# Patient Record
Sex: Male | Born: 1995 | Race: White | Hispanic: No | Marital: Single | Smoking: Current every day smoker
Health system: Southern US, Community
[De-identification: ages and names within clinical notes are randomized; demographics above are authoritative.]

## PROBLEM LIST (undated history)

## (undated) HISTORY — PX: FINGER SURGERY: SHX640

---
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
## 2014-08-24 ENCOUNTER — Emergency Department
Admit: 2014-08-24 | Disposition: A | Discharge status: Home / Self Care | Payer: Self-pay | Admitting: Emergency Medicine

## 2014-08-24 LAB — CBC WITH DIFFERENTIAL/PLATELET
Basophil #: 0 10*3/uL (ref 0.0–0.1)
Basophil %: 0.2 %
EOS PCT: 0.9 %
Eosinophil #: 0.2 10*3/uL (ref 0.0–0.7)
HCT: 53.1 % — ABNORMAL HIGH (ref 40.0–52.0)
HGB: 18.1 g/dL — ABNORMAL HIGH (ref 13.0–18.0)
LYMPHS ABS: 1.8 10*3/uL (ref 1.0–3.6)
Lymphocyte %: 8.3 %
MCH: 31.4 pg (ref 26.0–34.0)
MCHC: 34 g/dL (ref 32.0–36.0)
MCV: 92 fL (ref 80–100)
Monocyte #: 0.8 x10 3/mm (ref 0.2–1.0)
Monocyte %: 3.7 %
NEUTROS PCT: 86.9 %
Neutrophil #: 19.3 10*3/uL — ABNORMAL HIGH (ref 1.4–6.5)
PLATELETS: 270 10*3/uL (ref 150–440)
RBC: 5.75 10*6/uL (ref 4.40–5.90)
RDW: 12.7 % (ref 11.5–14.5)
WBC: 22.2 10*3/uL — ABNORMAL HIGH (ref 3.8–10.6)

## 2014-08-24 LAB — COMPREHENSIVE METABOLIC PANEL
ALK PHOS: 118 U/L
ANION GAP: 9 (ref 7–16)
Albumin: 5.2 g/dL — ABNORMAL HIGH
BILIRUBIN TOTAL: 0.7 mg/dL
BUN: 13 mg/dL
CALCIUM: 9.6 mg/dL
CO2: 25 mmol/L
CREATININE: 1.1 mg/dL
Chloride: 104 mmol/L
EGFR (Non-African Amer.): >60
GLUCOSE: 131 mg/dL — AB
Potassium: 3.9 mmol/L
SGOT(AST): 33 U/L
SGPT (ALT): 34 U/L
SODIUM: 138 mmol/L
Total Protein: 8.4 g/dL — ABNORMAL HIGH

## 2014-08-24 LAB — ETHANOL

## 2014-08-24 LAB — URINALYSIS, COMPLETE
BILIRUBIN, UR: NEGATIVE
Blood: NEGATIVE
Glucose,UR: NEGATIVE mg/dL (ref 0–75)
KETONE: NEGATIVE
Leukocyte Esterase: NEGATIVE
Nitrite: NEGATIVE
Ph: 6 (ref 4.5–8.0)
Specific Gravity: 1.035 (ref 1.003–1.030)
Squamous Epithelial: NONE SEEN

## 2014-08-24 LAB — LIPASE, BLOOD: Lipase: 29 U/L

## 2014-08-24 LAB — DRUG SCREEN, URINE
AMPHETAMINES, UR SCREEN: NEGATIVE
Barbiturates, Ur Screen: NEGATIVE
Benzodiazepine, Ur Scrn: NEGATIVE
COCAINE METABOLITE, UR ~~LOC~~: NEGATIVE
Cannabinoid 50 Ng, Ur ~~LOC~~: POSITIVE
MDMA (Ecstasy)Ur Screen: NEGATIVE
Methadone, Ur Screen: NEGATIVE
OPIATE, UR SCREEN: NEGATIVE
Phencyclidine (PCP) Ur S: NEGATIVE
TRICYCLIC, UR SCREEN: NEGATIVE

## 2014-08-24 LAB — CK TOTAL AND CKMB (NOT AT ARMC)
CK, Total: 544 U/L — ABNORMAL HIGH
CK-MB: 4.7 ng/mL

## 2014-08-24 LAB — TROPONIN I

## 2015-03-13 ENCOUNTER — Ambulatory Visit: Admission: EM | Admit: 2015-03-13 | Discharge: 2015-03-13 | Discharge status: Absent without leave | Payer: Self-pay

## 2016-01-27 IMAGING — CR DG CHEST 1V PORT
1 series · 1 of 1 positions shown · non-contrast
Comparison: 05/21/2005

CLINICAL DATA: Right chest pain

EXAM:
PORTABLE CHEST - 1 VIEW

[x chest ap]
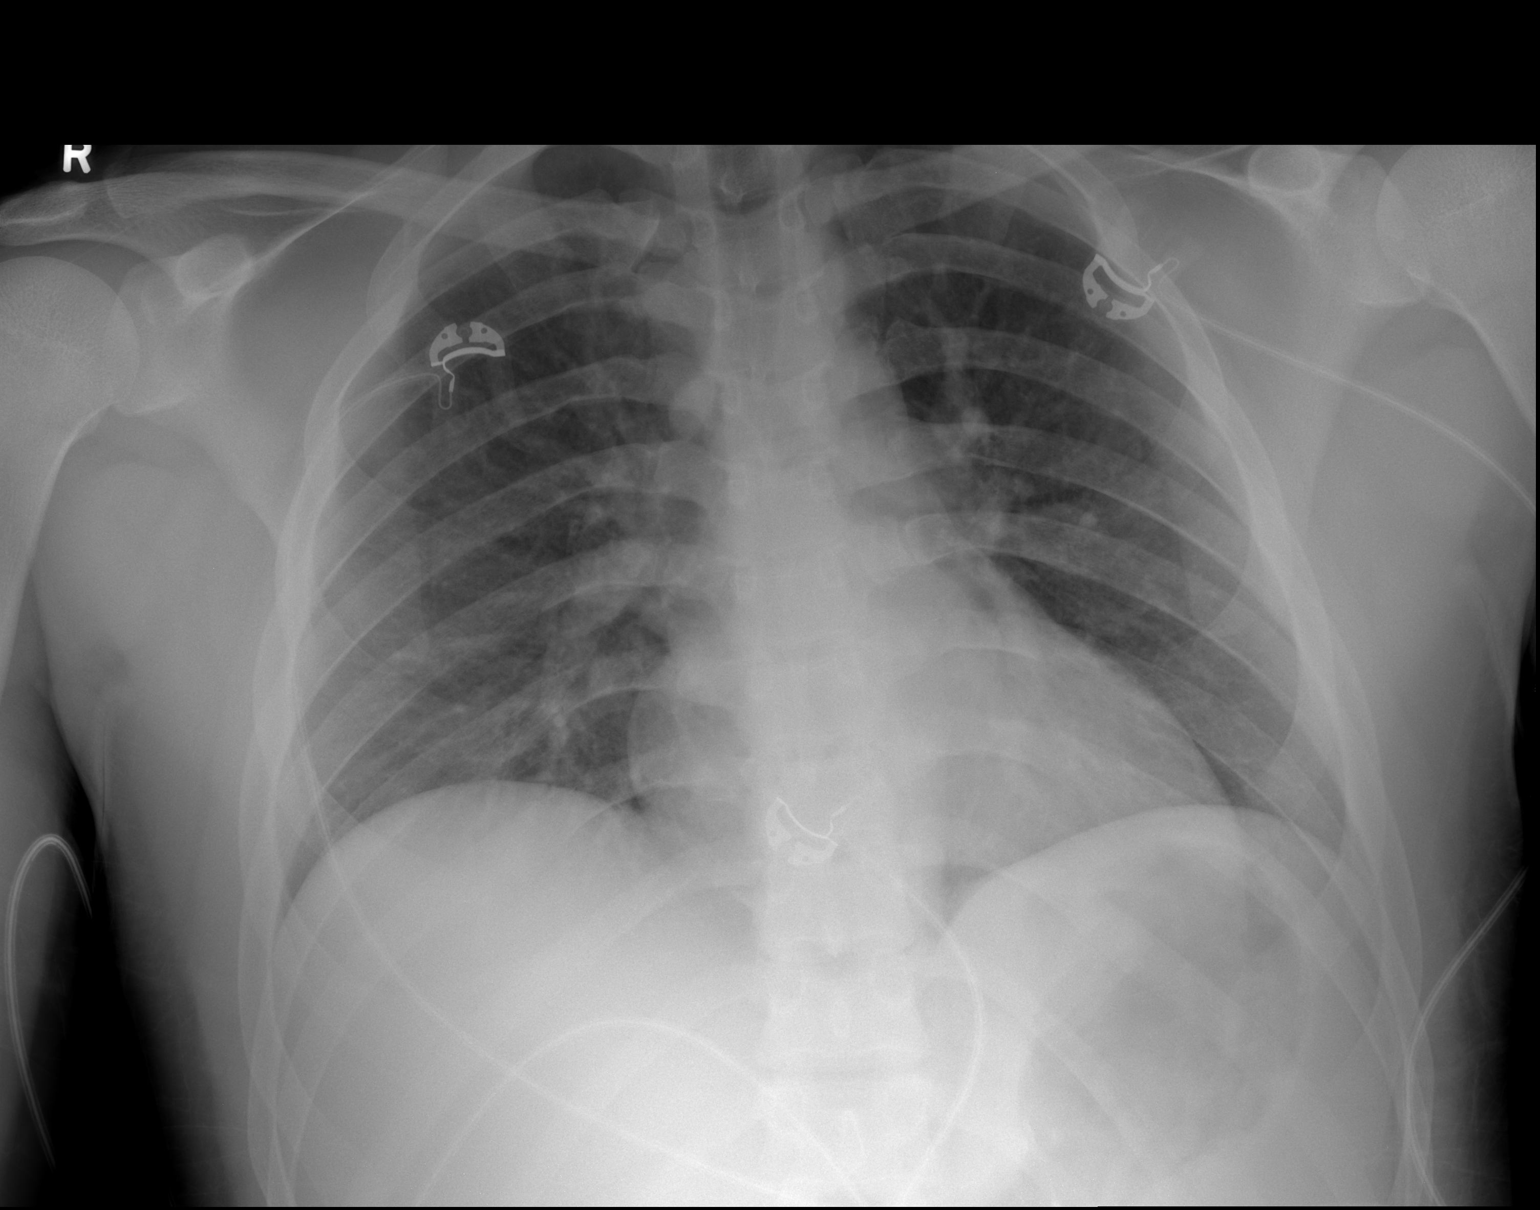

[1 of 1 positions shown; findings below may reference images not displayed]

FINDINGS: Minimal atelectasis at the right lung base. Otherwise, no
consolidation, pleural effusion, pneumothorax. Cardiomediastinal
contours are similar to prior, within normal range. No acute osseous
finding.
IMPRESSION: No radiographic evidence of active cardiopulmonary disease.

## 2016-02-15 ENCOUNTER — Encounter: Payer: Self-pay | Admitting: Emergency Medicine

## 2016-02-15 ENCOUNTER — Emergency Department: Payer: Self-pay

## 2016-02-15 ENCOUNTER — Emergency Department
Admission: EM | Admit: 2016-02-15 | Discharge: 2016-02-15 | Disposition: A | Discharge status: Home / Self Care | Payer: Self-pay | Attending: Emergency Medicine | Admitting: Emergency Medicine

## 2016-02-15 DIAGNOSIS — S62339A Displaced fracture of neck of unspecified metacarpal bone, initial encounter for closed fracture: Secondary | ICD-10-CM

## 2016-02-15 DIAGNOSIS — Y999 Unspecified external cause status: Secondary | ICD-10-CM | POA: Insufficient documentation

## 2016-02-15 DIAGNOSIS — S62336A Displaced fracture of neck of fifth metacarpal bone, right hand, initial encounter for closed fracture: Secondary | ICD-10-CM | POA: Insufficient documentation

## 2016-02-15 DIAGNOSIS — Y9289 Other specified places as the place of occurrence of the external cause: Secondary | ICD-10-CM | POA: Insufficient documentation

## 2016-02-15 DIAGNOSIS — Y9389 Activity, other specified: Secondary | ICD-10-CM | POA: Insufficient documentation

## 2016-02-15 DIAGNOSIS — W2201XA Walked into wall, initial encounter: Secondary | ICD-10-CM | POA: Insufficient documentation

## 2016-02-15 MED ORDER — OXYCODONE-ACETAMINOPHEN 5-325 MG PO TABS
1.0000 | ORAL_TABLET | Freq: Once | ORAL | Status: AC
  Administered 2016-02-15: 1 via ORAL
  Filled 2016-02-15: qty 1

## 2016-02-15 MED ORDER — OXYCODONE-ACETAMINOPHEN 5-325 MG PO TABS: 1.0000 | ORAL_TABLET | Freq: Four times a day (QID) | ORAL | 0 refills | Status: AC | PRN

## 2016-02-15 NOTE — ED Notes (Signed)
Ice applied

## 2016-02-15 NOTE — ED Notes (Signed)
Discharge instructions reviewed with patient. Patient verbalized understanding. Patient ambulated to lobby without difficulty.   

## 2016-02-15 NOTE — ED Provider Notes (Signed)
[facility] Emergency Department Provider Note  ____________________________________________  Time seen: Approximately 7:22 PM  I have reviewed the triage vital signs and the nursing notes.   HISTORY  Chief Complaint Hand Injury    HPI Paul Sims is a 20 y.o. male , NAD, presents to the emergency accompanied by his wife. Patient states he was working on his car when the hood fell onto his right hand that he became frustrated and hit a wall. He immediately had pain, swelling and a deformity about the dorsal portion of the right hand. His not being able to move the right fourth and fifth finger since the injury. Denies open wounds or lacerations. Has not noted any bruising. Denies numbness, weakness, tingling. States he fractured the fourth and fifth metacarpals of the same hand when he was in eighth grade but it did not require surgery. Patient complains of no pain in any other part of the body.   History reviewed. No pertinent past medical history.  There are no active problems to display for this patient.   History reviewed. No pertinent surgical history.  Prior to Admission medications   Medication Sig Start Date End Date Taking? Authorizing Provider  oxyCODONE-acetaminophen (ROXICET) 5-325 MG tablet Take 1 tablet by mouth every 6 (six) hours as needed. 02/15/16 02/14/17  Chandra Asher L Michelle Wnek, PA-C    Allergies Review of patient's allergies indicates no known allergies.  No family history on file.  Social History Social History  Substance Use Topics  . Smoking status: Never Smoker  . Smokeless tobacco: Never Used  . Alcohol use Not on file     Review of Systems  Constitutional: No fatigue Musculoskeletal: Positive right hand pain with deformity and decreased range of motion of the fourth and fifth fingers.  Skin: Positive swelling right hand. Negative for rash, redness, open wounds or lacerations. Neurological: Negative for numbness, weakness,  tingling.    ____________________________________________   PHYSICAL EXAM:  VITAL SIGNS: ED Triage Vitals  Enc Vitals Group     BP 02/15/16 1841 (!) 152/82     Pulse Rate 02/15/16 1841 90     Resp 02/15/16 1841 18     Temp 02/15/16 1841 98.5 F (36.9 C)     Temp Source 02/15/16 1841 Oral     SpO2 02/15/16 1841 99 %     Weight 02/15/16 1837 175 lb (79.4 kg)     Height 02/15/16 1837 5\' 9"  (1.753 m)     Head Circumference --      Peak Flow --      Pain Score 02/15/16 1837 10     Pain Loc --      Pain Edu? --      Excl. in GC? --      Constitutional: Alert and oriented. Well appearing and in no acute distress. Eyes: Conjunctivae are normal.  Head: Atraumatic. Cardiovascular: Good peripheral circulation with 2+ pulses noted in the right upper extremity. Capillary refill is brisk in all digits of the right hand. Respiratory: Normal respiratory effort without tachypnea or retractions. Musculoskeletal: Tenderness to palpation about the right fifth metacarpal with significant deformity noted about the dorsal, distal aspect. Patient unable to actively move the right fourth and fifth fingers. All digits of the fourth and fifth fingers can be moved in full range of motion passively. Neurologic:  Normal speech and language. No gross focal neurologic deficits are appreciated.  Skin:  Skin is warm, dry and intact. No redness, warmth, skin sores, lacerations  noted. Psychiatric: Mood and affect are normal. Speech and behavior are normal. Patient exhibits appropriate insight and judgement.   ____________________________________________   LABS  None ____________________________________________  EKG  None ____________________________________________  RADIOLOGY I, Paul Sims, personally viewed and evaluated these images (plain radiographs) as part of my medical decision making, as well as reviewing the written report by the radiologist.  Dg Hand Complete Right  Result Date:  02/15/2016 CLINICAL DATA:  20 year old male hit wall. Pain. Initial encounter. EXAM: RIGHT HAND - COMPLETE 3+ VIEW COMPARISON:  02/21/2013 right second finger films. 04/19/2011 hand films. FINDINGS: Acute on chronic right fifth metacarpal fracture with angulation at the fracture site. Pathologic fracture not excluded by plain film exam although I suspect findings are related to acute on chronic trauma. 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 IMPRESSION: Acute on chronic right fifth metacarpal fracture with angulation at the fracture site. Pathologic fracture not excluded by plain film exam although I suspect findings are related to acute on chronic trauma. Electronically Signed   By: Lacy Duverney M.D.   On: 02/15/2016 19:09    ____________________________________________    PROCEDURES  Procedure(s) performed: None   Procedures   Medications  oxyCODONE-acetaminophen (PERCOCET/ROXICET) 5-325 MG per tablet 1 tablet (1 tablet Oral Given 02/15/16 1948)     ____________________________________________   INITIAL IMPRESSION / ASSESSMENT AND PLAN / ED COURSE  Pertinent labs & imaging results that were available during my care of the patient were reviewed by me and considered in my medical decision making (see chart for details).  Clinical Course  Comment By Time  I spoke with Dr. Martha Clan, oncall orthopedic, to discuss the patient's history, physical exam findings and radiology results. He suggests to splint the hand and he will see the patient in office tomorrow to discuss if surgery will be needed.  Paul Pigeon, PA-C 10/12 1928    Patient's diagnosis is consistent with closed boxer's fracture of the right hand. Patient will be discharged home with prescriptions for roxicet to take as needed for severe pain, sparingly. Patient is to follow up with Dr. Martha Clan tomorrow morning for further evaluation and treatment. Patient is given ED precautions to return to the ED for any  worsening or new symptoms.    ____________________________________________  FINAL CLINICAL IMPRESSION(S) / ED DIAGNOSES  Final diagnoses:  Closed boxer's fracture, initial encounter      NEW MEDICATIONS STARTED DURING THIS VISIT:  New Prescriptions   OXYCODONE-ACETAMINOPHEN (ROXICET) 5-325 MG TABLET    Take 1 tablet by mouth every 6 (six) hours as needed.         Paul Pigeon, PA-C 02/15/16 2114    Jennye Moccasin, MD 02/15/16 [phone]

## 2016-02-15 NOTE — ED Triage Notes (Signed)
Hit a wall, deformity to right hand.  Ice applied

## 2016-08-12 ENCOUNTER — Emergency Department
Admission: EM | Admit: 2016-08-12 | Discharge: 2016-08-12 | Disposition: A | Discharge status: Home / Self Care | Payer: Self-pay | Attending: Emergency Medicine | Admitting: Emergency Medicine

## 2016-08-12 ENCOUNTER — Encounter: Payer: Self-pay | Admitting: *Deleted

## 2016-08-12 DIAGNOSIS — G933 Postviral fatigue syndrome: Secondary | ICD-10-CM | POA: Insufficient documentation

## 2016-08-12 DIAGNOSIS — B9789 Other viral agents as the cause of diseases classified elsewhere: Secondary | ICD-10-CM

## 2016-08-12 DIAGNOSIS — J069 Acute upper respiratory infection, unspecified: Secondary | ICD-10-CM | POA: Insufficient documentation

## 2016-08-12 DIAGNOSIS — G9331 Postviral fatigue syndrome: Secondary | ICD-10-CM

## 2016-08-12 MED ORDER — PSEUDOEPH-BROMPHEN-DM 30-2-10 MG/5ML PO SYRP: 5.0000 mL | ORAL_SOLUTION | Freq: Four times a day (QID) | ORAL | 0 refills | Status: AC | PRN

## 2016-08-12 MED ORDER — IBUPROFEN 600 MG PO TABS: 600.0000 mg | ORAL_TABLET | Freq: Three times a day (TID) | ORAL | 0 refills | Status: AC | PRN

## 2016-08-12 NOTE — ED Provider Notes (Signed)
[facility] Emergency Department Provider Note   ____________________________________________   First MD Initiated Contact with Patient 08/12/16 [phone]     (approximate)  I have reviewed the triage vital signs and the nursing notes.   HISTORY  Chief Complaint Cough and Fatigue    HPI Trimaine Maser Moen is a 21 y.o. male patient complain of nonproductive cough for 3-4 days. Patient also complaining of fatigue. Patient state he went to work today but felt so bad he had to leave. Patient denies nausea ,vomiting, or diarrhea. Patient rates his pain as a 6/10. Patient describes pain as "achy". No palliative measures taken for this complaint.   History reviewed. No pertinent past medical history.  There are no active problems to display for this patient.   History reviewed. No pertinent surgical history.  Prior to Admission medications   Medication Sig Start Date End Date Taking? Authorizing Provider  brompheniramine-pseudoephedrine-DM 30-2-10 MG/5ML syrup Take 5 mLs by mouth 4 (four) times daily as needed. 08/12/16   Joni Reining, PA-C  ibuprofen (ADVIL,MOTRIN) 600 MG tablet Take 1 tablet (600 mg total) by mouth every 8 (eight) hours as needed. 08/12/16   Joni Reining, PA-C  oxyCODONE-acetaminophen (ROXICET) 5-325 MG tablet Take 1 tablet by mouth every 6 (six) hours as needed. 02/15/16 02/14/17  Jami L Hagler, PA-C    Allergies Patient has no known allergies.  History reviewed. No pertinent family history.  Social History Social History  Substance Use Topics  . Smoking status: Never Smoker  . Smokeless tobacco: Never Used  . Alcohol use Not on file    Review of Systems Constitutional: No fever/chills Eyes: No visual changes. ENT: No sore throat. Cardiovascular: Denies chest pain. Respiratory: Denies shortness of breath. Nonproductive cough. Gastrointestinal: No abdominal pain.  No nausea, no vomiting.  No diarrhea.  No  constipation. Genitourinary: Negative for dysuria. Musculoskeletal: Negative for back pain. Skin: Negative for rash. Neurological: Negative for headaches, focal weakness or numbness.    ____________________________________________   PHYSICAL EXAM:  VITAL SIGNS: ED Triage Vitals [08/12/16 0715]  Enc Vitals Group     BP 128/77     Pulse Rate 71     Resp 18     Temp 98.8 F (37.1 C)     Temp Source Oral     SpO2 98 %     Weight 185 lb (83.9 kg)     Height  (1.803 m)     Head Circumference      Peak Flow      Pain Score 6     Pain Loc      Pain Edu?      Excl. in GC?     Constitutional: Alert and oriented. Well appearing and in no acute distress. Eyes: Conjunctivae are normal. PERRL. EOMI. Head: Atraumatic. Nose: No congestion/rhinnorhea. Mouth/Throat: Mucous membranes are moist.  Oropharynx non-erythematous. Neck: No stridor.  No cervical spine tenderness to palpation. Hematological/Lymphatic/Immunilogical: No cervical lymphadenopathy. Cardiovascular: Normal rate, regular rhythm. Grossly normal heart sounds.  Good peripheral circulation. Respiratory: Normal respiratory effort.  No retractions. Lungs CTAB. Gastrointestinal: Soft and nontender. No distention. No abdominal bruits. No CVA tenderness. Musculoskeletal: No lower extremity tenderness nor edema.  No joint effusions. Neurologic:  Normal speech and language. No gross focal neurologic deficits are appreciated. No gait instability. Skin:  Skin is warm, dry and intact. No rash noted. Psychiatric: Mood and affect are normal. Speech and behavior are normal.  ____________________________________________   LABS (all labs  ordered are listed, but only abnormal results are displayed)  Labs Reviewed - No data to display ____________________________________________  EKG   ____________________________________________  RADIOLOGY   ____________________________________________   PROCEDURES  Procedure(s)  performed: None  Procedures  Critical Care performed: No  ____________________________________________   INITIAL IMPRESSION / ASSESSMENT AND PLAN / ED COURSE  Pertinent labs & imaging results that were available during my care of the patient were reviewed by me and considered in my medical decision making (see chart for details).  Viral illness.      ____________________________________________   FINAL CLINICAL IMPRESSION(S) / ED DIAGNOSES  Final diagnoses:  Viral URI with cough  Postviral fatigue syndrome  Patient given discharge care instructions. Patient given a work note. Patient given a prescription for Bromfed-DM and ibuprofen. Patient [facility].    NEW MEDICATIONS STARTED DURING THIS VISIT:  New Prescriptions   BROMPHENIRAMINE-PSEUDOEPHEDRINE-DM 30-2-10 MG/5ML SYRUP    Take 5 mLs by mouth 4 (four) times daily as needed.   IBUPROFEN (ADVIL,MOTRIN) 600 MG TABLET    Take 1 tablet (600 mg total) by mouth every 8 (eight) hours as needed.     Note:  This document was prepared using Dragon voice recognition software and may include unintentional dictation errors.    Joni Reining, PA-C 08/12/16 1610    Jene Every, MD 08/13/16 [phone]

## 2016-08-12 NOTE — ED Triage Notes (Signed)
Pt reports dry cough for 3-4 days; just feeling tired all the time; pt ambulatory with steady gait; talking in complete coherent sentences

## 2016-10-23 ENCOUNTER — Encounter: Payer: Self-pay | Admitting: *Deleted

## 2016-10-23 ENCOUNTER — Ambulatory Visit (INDEPENDENT_AMBULATORY_CARE_PROVIDER_SITE_OTHER): Payer: Self-pay

## 2016-10-23 ENCOUNTER — Ambulatory Visit
Admission: EM | Admit: 2016-10-23 | Discharge: 2016-10-23 | Disposition: A | Discharge status: Home / Self Care | Payer: Self-pay | Attending: Emergency Medicine | Admitting: Emergency Medicine

## 2016-10-23 DIAGNOSIS — W268XXA Contact with other sharp object(s), not elsewhere classified, initial encounter: Secondary | ICD-10-CM

## 2016-10-23 DIAGNOSIS — S61313A Laceration without foreign body of left middle finger with damage to nail, initial encounter: Secondary | ICD-10-CM

## 2016-10-23 MED ORDER — CEPHALEXIN 500 MG PO CAPS: 500.0000 mg | ORAL_CAPSULE | Freq: Four times a day (QID) | ORAL | 0 refills | Status: AC

## 2016-10-23 MED ORDER — LIDOCAINE HCL (PF) 1 % IJ SOLN
5.0000 mL | Freq: Once | INTRAMUSCULAR | Status: DC
Start: 2016-10-23 — End: 2016-10-23

## 2016-10-23 MED ORDER — TETANUS-DIPHTH-ACELL PERTUSSIS 5-2.5-18.5 LF-MCG/0.5 IM SUSP
0.5000 mL | Freq: Once | INTRAMUSCULAR | Status: AC
  Administered 2016-10-23: 0.5 mL via INTRAMUSCULAR

## 2016-10-23 MED ORDER — MUPIROCIN 2 % EX OINT: TOPICAL_OINTMENT | CUTANEOUS | 0 refills | Status: AC

## 2016-10-23 NOTE — Discharge Instructions (Signed)
Take medication as prescribed. Rest. Drink plenty of fluids. Keep clean as discussed.   Return in 7-10 days for suture removal.   Follow up with your primary care physician this week as needed. Return to Urgent care for redness, swelling, drainage, new or worsening concerns.

## 2016-10-23 NOTE — ED Triage Notes (Signed)
Laceration to tip of left middle finger. Done with a router. Bleeding controlled.

## 2016-10-23 NOTE — ED Provider Notes (Signed)
[facility] ____________________________________________  Time seen: Approximately 7:08 PM  I have reviewed the triage vital signs and the nursing notes.   HISTORY  Chief Complaint Laceration   HPI Paul Sims is a 21 y.o. male presenting with wife at bedside for evaluation of left middle finger pain and laceration post injury that occurred just prior to arrival while at home. Patient reports that he was using an angled grinder to cut and the grinder jumped back hitting his left middle finger causing laceration. Patient reports this occurred just prior to arrival. No alleviating measures taken prior to arrival. Reports thinks his last tetanus immunization was about 4 years ago, but unsure. States mild pain to laceration site at this time. Denies any decreased range of motion, paresthesias or pain radiation. Reports right hand. Denies fall to the ground, head injury or loss consciousness. Reports otherwise feels well.Denies recent sickness. Denies recent antibiotic use.    History reviewed. No pertinent past medical history. Denies There are no active problems to display for this patient.   Past Surgical History:  Procedure Laterality Date  . FINGER SURGERY Right       Current Facility-Administered Medications:  .  lidocaine (PF) (XYLOCAINE) 1 % injection 5 mL, 5 mL, Other, Once, Renford Dills, NP  Current Outpatient Prescriptions:  .  brompheniramine-pseudoephedrine-DM 30-2-10 MG/5ML syrup, Take 5 mLs by mouth 4 (four) times daily as needed., Disp: 120 mL, Rfl: 0 .  cephALEXin (KEFLEX) 500 MG capsule, Take 1 capsule (500 mg total) by mouth 4 (four) times daily., Disp: 20 capsule, Rfl: 0 .  ibuprofen (ADVIL,MOTRIN) 600 MG tablet, Take 1 tablet (600 mg total) by mouth every 8 (eight) hours as needed., Disp: 15 tablet, Rfl: 0 .  mupirocin ointment (BACTROBAN) 2 %, Apply three times a day for 5 days., Disp: 22 g, Rfl: 0 .  oxyCODONE-acetaminophen (ROXICET) 5-325  MG tablet, Take 1 tablet by mouth every 6 (six) hours as needed., Disp: 20 tablet, Rfl: 0  Allergies Patient has no known allergies.  History reviewed. No pertinent family history.  Social History Social History  Substance Use Topics  . Smoking status: Current Every Day Smoker  . Smokeless tobacco: Current User  . Alcohol use No    Review of Systems Constitutional: No fever/chills Cardiovascular: Denies chest pain. Respiratory: Denies shortness of breath. Gastrointestinal: No abdominal pain.  Musculoskeletal: Negative for back pain. Skin: As above. ____________________________________________   PHYSICAL EXAM:  VITAL SIGNS: ED Triage Vitals  Enc Vitals Group     BP 10/23/16 1841 118/65     Pulse Rate 10/23/16 1841 74     Resp 10/23/16 1841 16     Temp 10/23/16 1841 98.5 F (36.9 C)     Temp Source 10/23/16 1841 Oral     SpO2 10/23/16 1841 100 %     Weight 10/23/16 1843 180 lb (81.6 kg)     Height 10/23/16 1843 5\' 10"  (1.778 m)     Head Circumference --      Peak Flow --      Pain Score --      Pain Loc --      Pain Edu? --      Excl. in GC? --     Constitutional: Alert and oriented. Well appearing and in no acute distress. Cardiovascular: Normal rate, regular rhythm. Grossly normal heart sounds.  Good peripheral circulation. Respiratory: Normal respiratory effort without tachypnea nor retractions. Breath sounds are clear and equal bilaterally. No wheezes, rales, rhonchi.  Musculoskeletal:  Ambulatory with steady gait. Except: Left third digit distal palmer aspect laceration 2.7 cm the begins on palmer aspect and goes upwards slightly into distal tip of the nail, no foreign bodies visualized, no nail bed laceration noted, normal distal sensation and capillary refill, no motor or tendon deficit, no tendon or bone visualized. Neurologic:  Normal speech and language. Speech is normal. No gait instability.  Skin:  Skin is warm, dry.  Psychiatric: Mood and affect are  normal. Speech and behavior are normal. Patient exhibits appropriate insight and judgment   ___________________________________________   LABS (all labs ordered are listed, but only abnormal results are displayed)  Labs Reviewed - No data to display ____________________________________________  RADIOLOGY  Dg Finger Middle Left  Result Date: 10/23/2016 CLINICAL DATA:  Laceration to tip of LEFT middle finger with a router EXAM: LEFT MIDDLE FINGER 2+V COMPARISON:  None FINDINGS: Osseous mineralization normal. Joint spaces preserved. No fracture, dislocation, or bone destruction. No radiopaque foreign bodies. IMPRESSION: No acute osseous abnormalities. Electronically Signed   By: Ulyses SouthwardMark  Boles M.D.   On: 10/23/2016 19:40   ____________________________________________   PROCEDURES Procedures  Procedure(s) performed:  Procedure explained and verbal consent obtained. Consent: Verbal consent obtained. Written consent not obtained. Risks and benefits: risks, benefits and alternatives were discussed Patient identity confirmed: verbally with patient and hospital-assigned identification number  Consent given by: patient   Laceration Repair Location: left middle finger Length: 2.7 cm Foreign bodies: no foreign bodies.  No nail bed laceration found.  Tendon involvement: none Nerve involvement: none Preparation: Patient was prepped and draped in the usual sterile fashion. Anesthesia with 1% Lidocaine 3 mls Irrigation solution: sterile water and betadine Irrigation method: jet lavage Amount of cleaning: copious Repaired with 4-0 nylon  Number of sutures: 4 Technique: simple interrupted  Approximation: loose Patient tolerate well. Wound well approximated post repair.  Antibiotic ointment and dressing applied.  Wound care instructions provided.  Observe for any signs of infection or other problems.      INITIAL IMPRESSION / ASSESSMENT AND PLAN / ED COURSE  Pertinent labs & imaging  results that were available during my care of the patient were reviewed by me and considered in my medical decision making (see chart for details).  Well-appearing patient. No acute distress. Presenting for left third digit pain post laceration occurred at home prior to arrival. Tetanus immunization updated in urgent care. Will evaluate x-ray and repair laceration.  Mental finger x-ray per radiologist, no acute osseous abnormalities, no radiopaque foreign bodies. Wound was copiously irrigated. No foreign bodies were found. Wound repaired. As the wound occurred from the dirty object, will prophylactic start patient on oral Keflex 5 days. Topical Bactroban. Discussed wound care, cleaning and wound monitoring. Return to urgent care in 7-10 days for suture removal. Discussed strict follow-up and return parameters sooner.Discussed indication, risks and benefits of medications with patient.  Discussed follow up with Primary care physician this week. Discussed follow up and return parameters including no resolution or any worsening concerns. Patient verbalized understanding and agreed to plan.   ____________________________________________   FINAL CLINICAL IMPRESSION(S) / ED DIAGNOSES  Final diagnoses:  Laceration of left middle finger without foreign body with damage to nail, initial encounter     Discharge Medication List as of 10/23/2016  8:20 PM    START taking these medications   Details  cephALEXin (KEFLEX) 500 MG capsule Take 1 capsule (500 mg total) by mouth 4 (four) times daily., Starting Wed 10/23/2016, Until Mon 10/28/2016,  Normal    mupirocin ointment (BACTROBAN) 2 % Apply three times a day for 5 days., Normal        Note: This dictation was prepared with Dragon dictation along with smaller phrase technology. Any transcriptional errors that result from this process are unintentional.         Renford Dills, NP 10/23/16 2024

## 2018-03-28 IMAGING — CR DG FINGER MIDDLE 2+V*L*
3 series · 3 of 3 positions shown · non-contrast
Comparison: None

CLINICAL DATA: Laceration to tip of LEFT middle finger with a
router

EXAM:
LEFT MIDDLE FINGER 2+V

[finger ap]
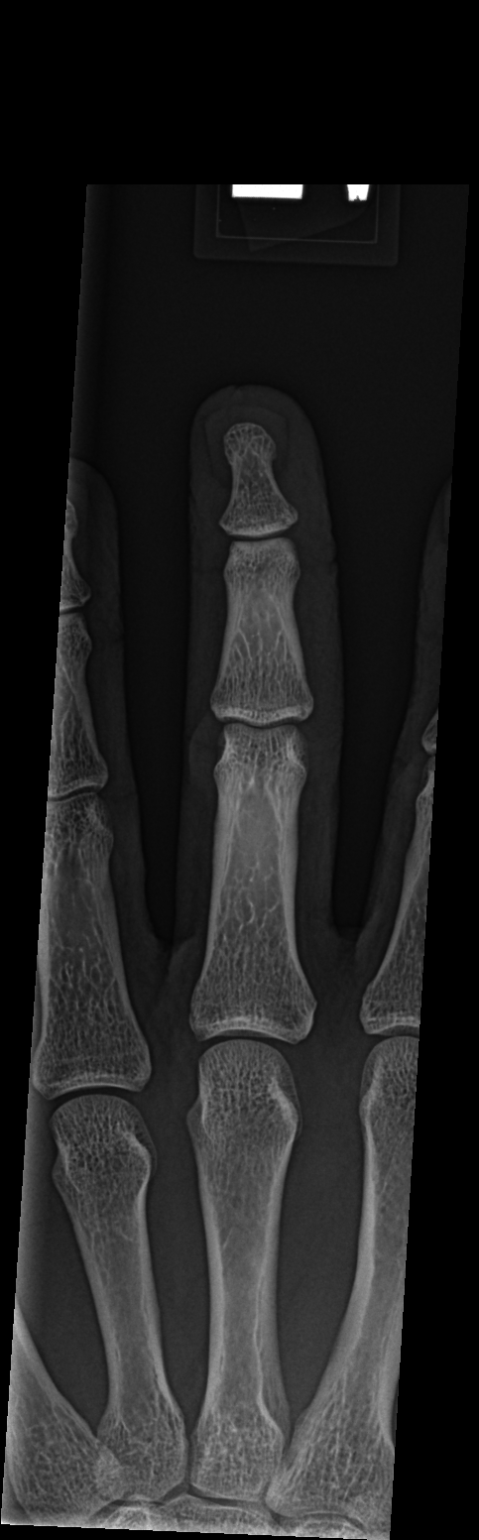

[finger obl]
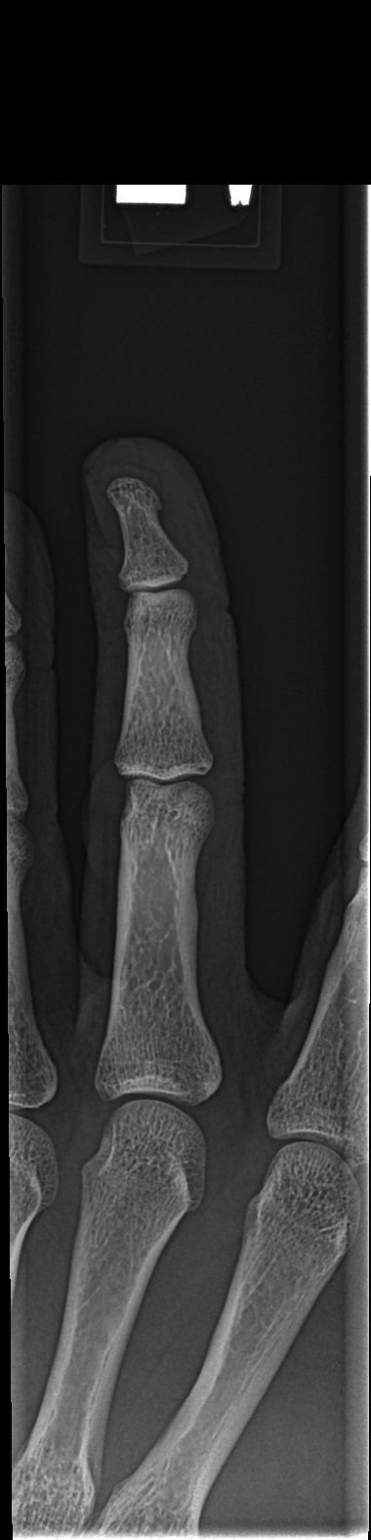

[finger lat]
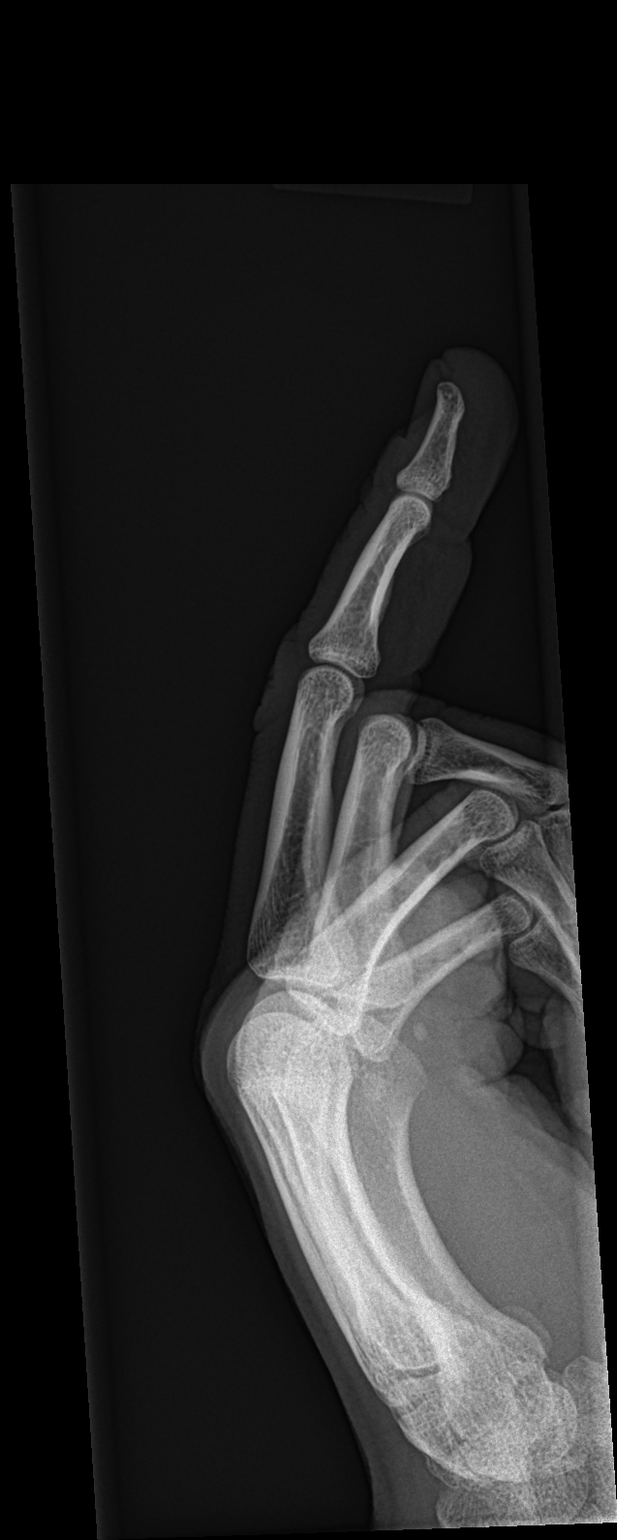

[3 of 3 positions shown; findings below may reference images not displayed]

FINDINGS: Osseous mineralization normal.

Joint spaces preserved.

No fracture, dislocation, or bone destruction.

No radiopaque foreign bodies.
IMPRESSION: No acute osseous abnormalities.

## 2021-02-23 ENCOUNTER — Ambulatory Visit: Payer: Self-pay | Admitting: Nurse Practitioner
